# Patient Record
Sex: Female | Born: 1995
Health system: Southern US, Community
[De-identification: ages and names within clinical notes are randomized; demographics above are authoritative.]

---

## 2020-06-18 ENCOUNTER — Other Ambulatory Visit: Payer: Self-pay

## 2020-06-18 ENCOUNTER — Emergency Department (HOSPITAL_COMMUNITY): Payer: Self-pay

## 2020-06-18 ENCOUNTER — Emergency Department (HOSPITAL_COMMUNITY)
Admission: EM | Admit: 2020-06-18 | Discharge: 2020-06-19 | Disposition: A | Discharge status: Home / Self Care | Payer: Self-pay | Attending: Emergency Medicine | Admitting: Emergency Medicine

## 2020-06-18 DIAGNOSIS — Z5321 Procedure and treatment not carried out due to patient leaving prior to being seen by health care provider: Secondary | ICD-10-CM | POA: Insufficient documentation

## 2020-06-18 DIAGNOSIS — M549 Dorsalgia, unspecified: Secondary | ICD-10-CM | POA: Insufficient documentation

## 2020-06-18 DIAGNOSIS — R079 Chest pain, unspecified: Secondary | ICD-10-CM | POA: Insufficient documentation

## 2020-06-18 NOTE — ED Triage Notes (Signed)
Pt presents to ED POV. Pt c/o R CP that radiates towards R back. Pt reports that pain began after lunch but does not feel similar to acid reflux. Pt denies and nausea, weakness, diaphoresis.

## 2020-06-19 LAB — BASIC METABOLIC PANEL
Anion gap: 12 (ref 5–15)
BUN: 18 mg/dL (ref 6–20)
CO2: 24 mmol/L (ref 22–32)
Calcium: 9.2 mg/dL (ref 8.9–10.3)
Chloride: 101 mmol/L (ref 98–111)
Creatinine, Ser: 0.79 mg/dL (ref 0.44–1.00)
GFR calc Af Amer: >60 mL/min (ref >60)
GFR calc non Af Amer: >60 mL/min (ref >60)
Glucose, Bld: 87 mg/dL (ref 70–99)
Potassium: 3.8 mmol/L (ref 3.5–5.1)
Sodium: 137 mmol/L (ref 135–145)

## 2020-06-19 LAB — LIPASE, BLOOD: Lipase: 35 U/L (ref 11–51)

## 2020-06-19 LAB — CBC
HCT: 42.7 % (ref 36.0–46.0)
Hemoglobin: 13.9 g/dL (ref 12.0–15.0)
MCH: 29.4 pg (ref 26.0–34.0)
MCHC: 32.6 g/dL (ref 30.0–36.0)
MCV: 90.3 fL (ref 80.0–100.0)
Platelets: 260 10*3/uL (ref 150–400)
RBC: 4.73 MIL/uL (ref 3.87–5.11)
RDW: 12.4 % (ref 11.5–15.5)
WBC: 8 10*3/uL (ref 4.0–10.5)
nRBC: 0 % (ref 0.0–0.2)

## 2020-06-19 LAB — HEPATIC FUNCTION PANEL
ALT: 18 U/L (ref 0–44)
AST: 23 U/L (ref 15–41)
Albumin: 3.7 g/dL (ref 3.5–5.0)
Alkaline Phosphatase: 85 U/L (ref 38–126)
Bilirubin, Direct: 0.2 mg/dL (ref 0.0–0.2)
Indirect Bilirubin: 0.4 mg/dL (ref 0.3–0.9)
Total Bilirubin: 0.6 mg/dL (ref 0.3–1.2)
Total Protein: 7 g/dL (ref 6.5–8.1)

## 2020-06-19 LAB — I-STAT BETA HCG BLOOD, ED (MC, WL, AP ONLY): I-stat hCG, quantitative: <5 m[IU]/mL (ref <5)

## 2020-06-19 LAB — TROPONIN I (HIGH SENSITIVITY): Troponin I (High Sensitivity): 4 ng/L (ref <18)

## 2020-06-19 NOTE — ED Notes (Signed)
Called multiple times for vitals recheck with no response

## 2020-06-19 NOTE — ED Notes (Signed)
Called x1 for vitals with no response 

## 2021-10-02 IMAGING — CR DG CHEST 2V
2 series · 2 of 2 positions shown · non-contrast
Comparison: None.

CLINICAL DATA: Chest pain.

EXAM:
CHEST - 2 VIEW

[chest pa]
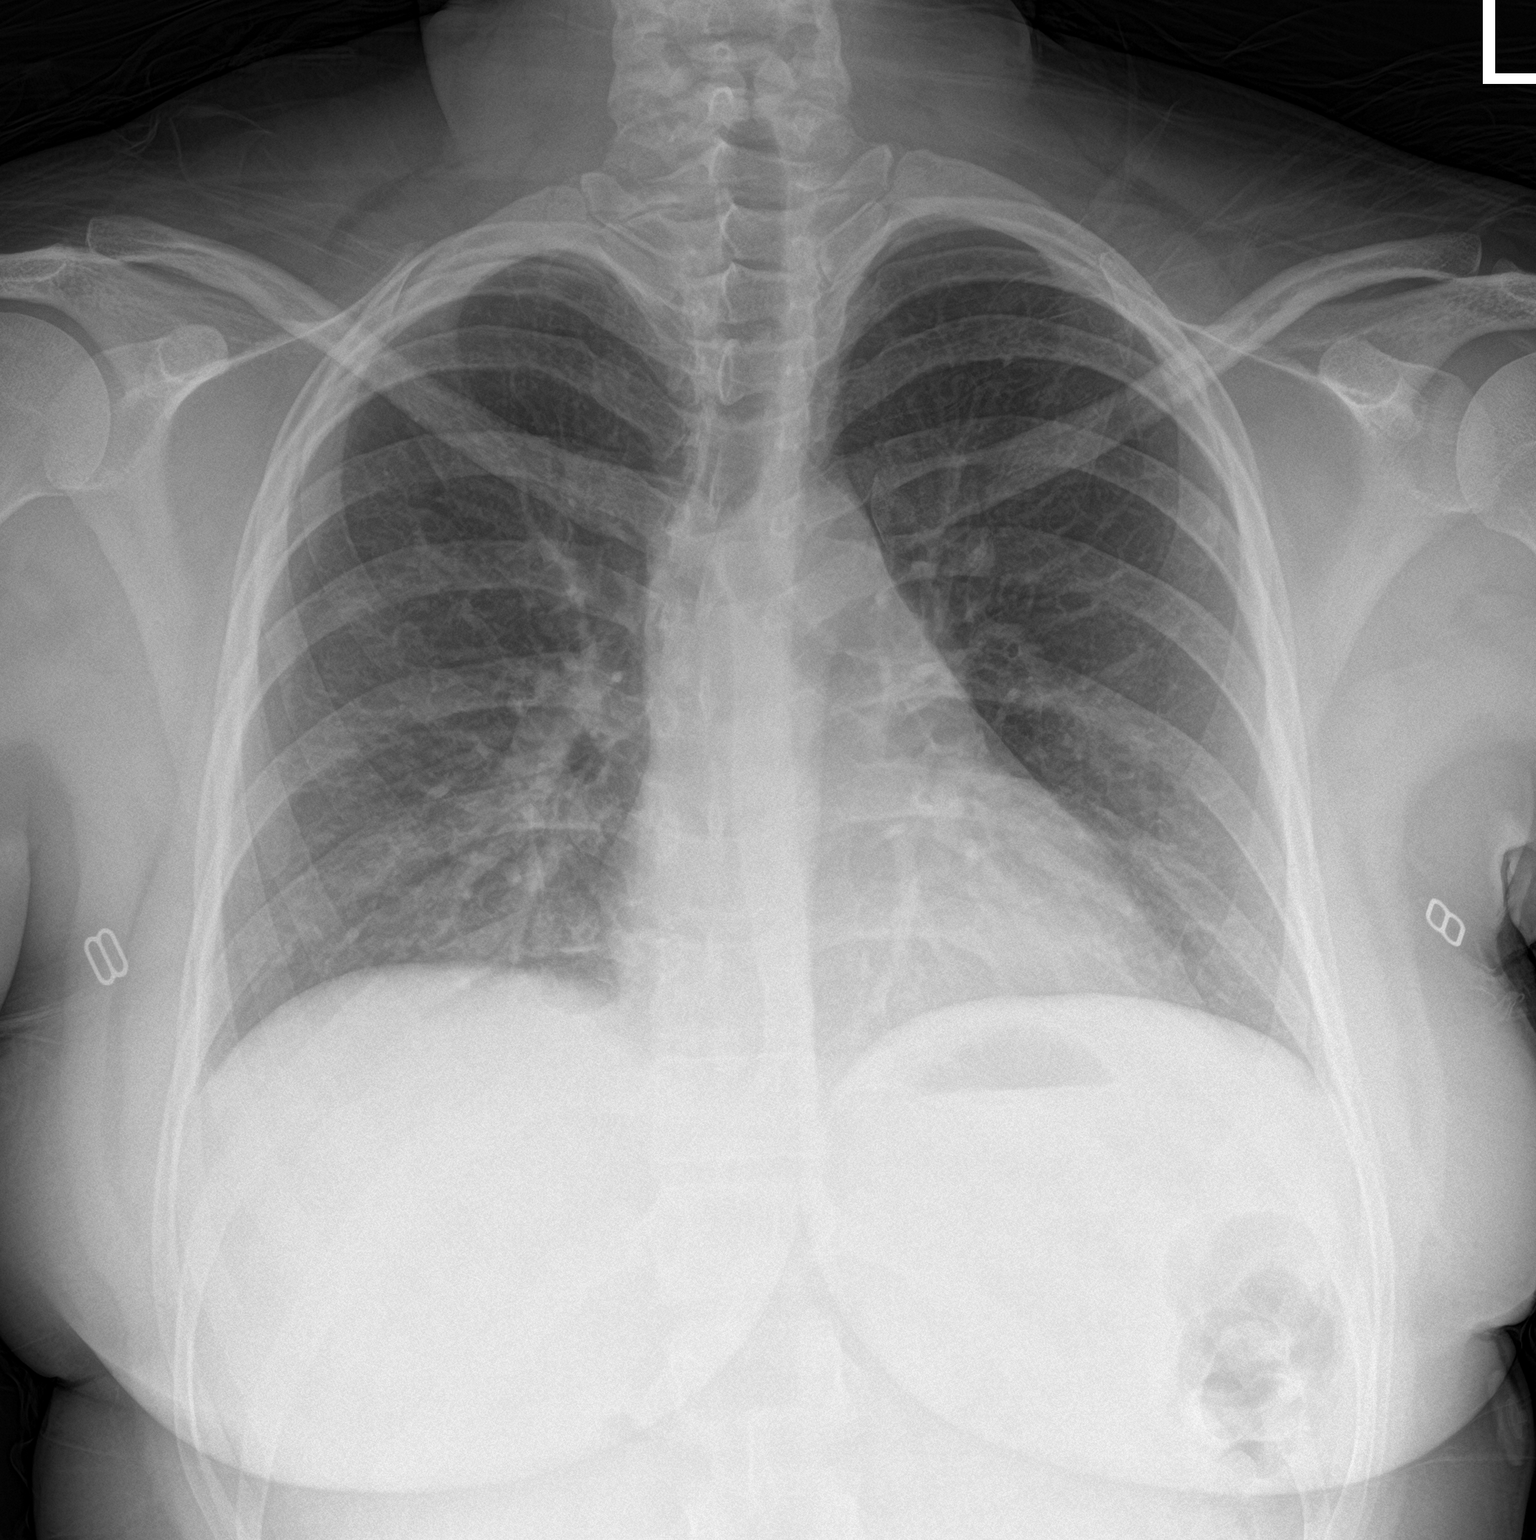

[chest lat]
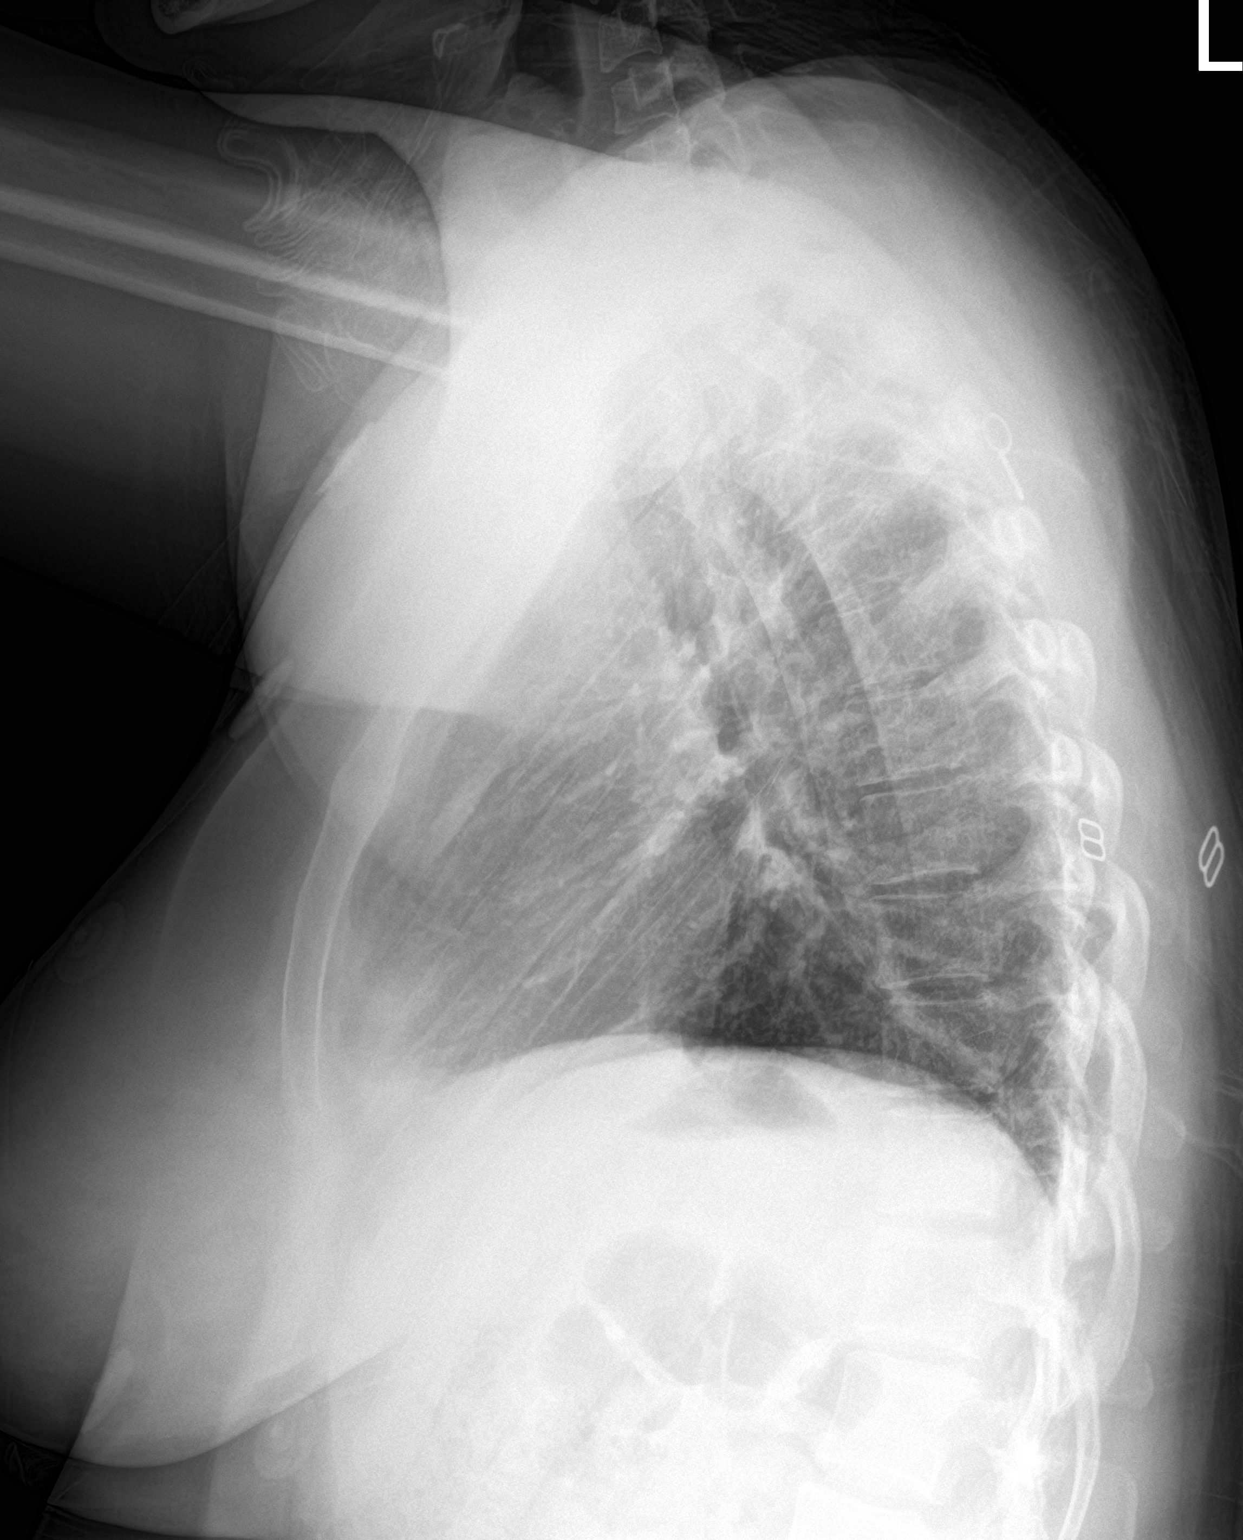

[2 of 2 positions shown; findings below may reference images not displayed]

FINDINGS: The heart size and mediastinal contours are within normal limits.
Both lungs are clear. The visualized skeletal structures are
unremarkable.
IMPRESSION: No active cardiopulmonary disease.
